# Patient Record
Sex: Male | Born: 1985 | Race: White | Hispanic: No | Marital: Single | Smoking: Current every day smoker
Health system: Southern US, Community
[De-identification: ages and names within clinical notes are randomized; demographics above are authoritative.]

## PROBLEM LIST (undated history)

## (undated) DIAGNOSIS — I10 Essential (primary) hypertension: Secondary | ICD-10-CM

## (undated) DIAGNOSIS — E785 Hyperlipidemia, unspecified: Secondary | ICD-10-CM

## (undated) HISTORY — PX: APPENDECTOMY: SHX54

## (undated) HISTORY — DX: Hemochromatosis, unspecified: E83.119

## (undated) HISTORY — DX: Hyperlipidemia, unspecified: E78.5

## (undated) HISTORY — DX: Essential (primary) hypertension: I10

---
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
## 2018-07-03 ENCOUNTER — Emergency Department
Admission: EM | Admit: 2018-07-03 | Discharge: 2018-07-03 | Disposition: A | Discharge status: Home / Self Care | Payer: 59 | Attending: Emergency Medicine | Admitting: Emergency Medicine

## 2018-07-03 ENCOUNTER — Other Ambulatory Visit: Payer: Self-pay

## 2018-07-03 ENCOUNTER — Emergency Department: Payer: 59

## 2018-07-03 DIAGNOSIS — F17210 Nicotine dependence, cigarettes, uncomplicated: Secondary | ICD-10-CM | POA: Insufficient documentation

## 2018-07-03 DIAGNOSIS — R079 Chest pain, unspecified: Secondary | ICD-10-CM

## 2018-07-03 LAB — CBC
HCT: 46.7 % (ref 39.0–52.0)
Hemoglobin: 16.5 g/dL (ref 13.0–17.0)
MCH: 33.6 pg (ref 26.0–34.0)
MCHC: 35.3 g/dL (ref 30.0–36.0)
MCV: 95.1 fL (ref 80.0–100.0)
Platelets: 240 10*3/uL (ref 150–400)
RBC: 4.91 MIL/uL (ref 4.22–5.81)
RDW: 11.5 % (ref 11.5–15.5)
WBC: 8.3 10*3/uL (ref 4.0–10.5)
nRBC: 0 % (ref 0.0–0.2)

## 2018-07-03 LAB — BASIC METABOLIC PANEL
Anion gap: 9 (ref 5–15)
BUN: 12 mg/dL (ref 6–20)
CO2: 27 mmol/L (ref 22–32)
Calcium: 9.6 mg/dL (ref 8.9–10.3)
Chloride: 102 mmol/L (ref 98–111)
Creatinine, Ser: 0.76 mg/dL (ref 0.61–1.24)
GFR calc Af Amer: >60 mL/min (ref >60)
GFR calc non Af Amer: >60 mL/min (ref >60)
Glucose, Bld: 106 mg/dL — ABNORMAL HIGH (ref 70–99)
Potassium: 4.6 mmol/L (ref 3.5–5.1)
Sodium: 138 mmol/L (ref 135–145)

## 2018-07-03 LAB — TROPONIN I
Troponin I: <0.03 ng/mL (ref <0.03)
Troponin I: <0.03 ng/mL (ref <0.03)

## 2018-07-03 MED ORDER — SODIUM CHLORIDE 0.9% FLUSH: 3.0000 mL | Freq: Once | INTRAVENOUS | Status: DC

## 2018-07-03 NOTE — ED Provider Notes (Signed)
[facility] Emergency Department Provider Note  Time seen: 1:43 PM  I have reviewed the triage vital signs and the nursing notes.   HISTORY  Chief Complaint Chest Pain    HPI Jonathan Fritz is a 33 y.o. male with no past medical history presents to the emergency department for chest discomfort.  According to the patient around 10:00 this morning he developed discomfort in the center of his chest described as more of a pressure sensation with some tingling feeling in his left arm.  Patient states the tingling feeling went away but he continues to have mild chest discomfort especially with movement or if he pushes on the chest.  Denies any cough congestion or fever.  Denies any leg pain or swelling.  No shortness of breath nausea or diaphoresis.  No cardiac history.  Overall the patient appears very well, hypertensive upon arrival with otherwise reassuring vitals.   History reviewed. No pertinent past medical history.  There are no active problems to display for this patient.   Past Surgical History:  Procedure Laterality Date  . APPENDECTOMY      Prior to Admission medications   Not on File    No Known Allergies  No family history on file.  Social History Social History   Tobacco Use  . Smoking status: Current Every Day Smoker    Types: Cigarettes  . Smokeless tobacco: Never Used  Substance Use Topics  . Alcohol use: Yes  . Drug use: Yes    Types: Marijuana    Review of Systems Constitutional: Negative for fever. Cardiovascular: Mild chest pain this morning. Respiratory: Negative for shortness of breath. Gastrointestinal: Negative for abdominal pain, vomiting Musculoskeletal: Negative for musculoskeletal complaints Skin: Negative for skin complaints  Neurological: Negative for headache All other ROS negative  ____________________________________________   PHYSICAL EXAM:  VITAL SIGNS: ED Triage Vitals  Enc Vitals Group     BP  07/03/18 1218 (!) 179/91     Pulse Rate 07/03/18 1218 65     Resp 07/03/18 1218 18     Temp 07/03/18 1218 98.9 F (37.2 C)     Temp Source 07/03/18 1218 Oral     SpO2 07/03/18 1218 98 %     Weight 07/03/18 1219 225 lb (102.1 kg)     Height 07/03/18 1219 6' (1.829 m)     Head Circumference --      Peak Flow --      Pain Score 07/03/18 1229 3     Pain Loc --      Pain Edu? --      Excl. in GC? --    Constitutional: Alert and oriented. Well appearing and in no distress. Eyes: Normal exam ENT      Head: Normocephalic and atraumatic.      Mouth/Throat: Mucous membranes are moist. Cardiovascular: Normal rate, regular rhythm.  Respiratory: Normal respiratory effort without tachypnea nor retractions. Breath sounds are clear  Gastrointestinal: Soft and nontender. No distention.  Musculoskeletal: Nontender with normal range of motion in all extremities. No lower extremity tenderness or edema.  Central chest wall tenderness to palpation. Neurologic:  Normal speech and language. No gross focal neurologic deficits  Skin:  Skin is warm, dry and intact.  Psychiatric: Mood and affect are normal.   ____________________________________________    EKG  EKG viewed and interpreted by myself shows a normal sinus rhythm at 73 bpm with a narrow QRS, normal axis, normal intervals, no concerning ST changes.  ____________________________________________  RADIOLOGY  Chest x-ray is negative.  ____________________________________________   INITIAL IMPRESSION / ASSESSMENT AND PLAN / ED COURSE  Pertinent labs & imaging results that were available during my care of the patient were reviewed by me and considered in my medical decision making (see chart for details).   Patient presents to the emergency department for chest discomfort since around 10:00 this morning.  Patient states minimal discomfort currently mostly with movement or if you push on the chest.  Patient is moderately hypertensive  otherwise normal vitals.  Reassuring physical exam.  EKG shows no acute findings.  Chest x-ray is clear.  We will check labs and continue to closely monitor.  Differential would include ACS, chest wall pain.  Reassuringly labs are normal including negative troponin.  We will repeat a troponin.  If the patient's repeat troponin remains negative anticipate likely discharge home.  Patient agreeable to plan of care.  Patient's repeat troponin is negative.  Continues to appear well.  Is moderately hypertensive I recommended the patient follow-up with her primary care doctor regarding this.  Patient agreeable to plan of care.  Provided my normal chest pain return precautions.  Jonathan Fritz was evaluated in Emergency Department on 07/03/2018 for the symptoms described in the history of present illness. He was evaluated in the context of the global COVID-19 pandemic, which necessitated consideration that the patient might be at risk for infection with the SARS-CoV-2 virus that causes COVID-19. Institutional protocols and algorithms that pertain to the evaluation of patients at risk for COVID-19 are in a state of rapid change based on information released by regulatory bodies including the CDC and federal and state organizations. These policies and algorithms were followed during the patient's care in the ED.  ____________________________________________   FINAL CLINICAL IMPRESSION(S) / ED DIAGNOSES  Chest pain    Minna Antis, MD 07/03/18 1452

## 2018-07-03 NOTE — ED Triage Notes (Signed)
Pt c/o left sided chest pain that radiates into the left arm with tingling for the past couple of hours, states he was at work when it started. Pt is in NAD at present. Respirations WNL

## 2019-04-04 ENCOUNTER — Encounter: Payer: Self-pay | Admitting: Oncology

## 2019-04-04 ENCOUNTER — Other Ambulatory Visit: Payer: Self-pay

## 2019-04-04 ENCOUNTER — Inpatient Hospital Stay: Payer: 59 | Attending: Oncology | Admitting: Oncology

## 2019-04-04 ENCOUNTER — Inpatient Hospital Stay: Payer: 59

## 2019-04-04 VITALS — BP 123/88 | HR 77 | Temp 97.3°F | Ht 72.0 in | Wt 207.0 lb

## 2019-04-04 DIAGNOSIS — E785 Hyperlipidemia, unspecified: Secondary | ICD-10-CM

## 2019-04-04 DIAGNOSIS — R7989 Other specified abnormal findings of blood chemistry: Secondary | ICD-10-CM

## 2019-04-04 DIAGNOSIS — K760 Fatty (change of) liver, not elsewhere classified: Secondary | ICD-10-CM | POA: Diagnosis present

## 2019-04-04 DIAGNOSIS — I10 Essential (primary) hypertension: Secondary | ICD-10-CM | POA: Diagnosis not present

## 2019-04-04 LAB — CBC WITH DIFFERENTIAL/PLATELET
Abs Immature Granulocytes: 0.04 10*3/uL (ref 0.00–0.07)
Basophils Absolute: 0.1 10*3/uL (ref 0.0–0.1)
Basophils Relative: 1 %
Eosinophils Absolute: 0.2 10*3/uL (ref 0.0–0.5)
Eosinophils Relative: 2 %
HCT: 44 % (ref 39.0–52.0)
Hemoglobin: 14.7 g/dL (ref 13.0–17.0)
Immature Granulocytes: 0 %
Lymphocytes Relative: 14 %
Lymphs Abs: 1.5 10*3/uL (ref 0.7–4.0)
MCH: 32 pg (ref 26.0–34.0)
MCHC: 33.4 g/dL (ref 30.0–36.0)
MCV: 95.9 fL (ref 80.0–100.0)
Monocytes Absolute: 0.8 10*3/uL (ref 0.1–1.0)
Monocytes Relative: 7 %
Neutro Abs: 8.1 10*3/uL — ABNORMAL HIGH (ref 1.7–7.7)
Neutrophils Relative %: 76 %
Platelets: 226 10*3/uL (ref 150–400)
RBC: 4.59 MIL/uL (ref 4.22–5.81)
RDW: 11.8 % (ref 11.5–15.5)
WBC: 10.7 10*3/uL — ABNORMAL HIGH (ref 4.0–10.5)
nRBC: 0 % (ref 0.0–0.2)

## 2019-04-04 LAB — COMPREHENSIVE METABOLIC PANEL
ALT: 53 U/L — ABNORMAL HIGH (ref 0–44)
AST: 36 U/L (ref 15–41)
Albumin: 4.8 g/dL (ref 3.5–5.0)
Alkaline Phosphatase: 49 U/L (ref 38–126)
Anion gap: 11 (ref 5–15)
BUN: 16 mg/dL (ref 6–20)
CO2: 24 mmol/L (ref 22–32)
Calcium: 9.6 mg/dL (ref 8.9–10.3)
Chloride: 101 mmol/L (ref 98–111)
Creatinine, Ser: 0.79 mg/dL (ref 0.61–1.24)
GFR calc Af Amer: >60 mL/min (ref >60)
GFR calc non Af Amer: >60 mL/min (ref >60)
Glucose, Bld: 99 mg/dL (ref 70–99)
Potassium: 4.2 mmol/L (ref 3.5–5.1)
Sodium: 136 mmol/L (ref 135–145)
Total Bilirubin: 0.8 mg/dL (ref 0.3–1.2)
Total Protein: 7.8 g/dL (ref 6.5–8.1)

## 2019-04-04 LAB — SEDIMENTATION RATE: Sed Rate: 8 mm/hr (ref 0–15)

## 2019-04-04 LAB — IRON AND TIBC
Iron: 100 ug/dL (ref 45–182)
Saturation Ratios: 28 % (ref 17.9–39.5)
TIBC: 363 ug/dL (ref 250–450)
UIBC: 263 ug/dL

## 2019-04-04 LAB — FERRITIN: Ferritin: 248 ng/mL (ref 24–336)

## 2019-04-04 NOTE — Progress Notes (Signed)
Patient is here today to establish care for hemochromatosis.

## 2019-04-07 ENCOUNTER — Encounter: Payer: Self-pay | Admitting: Oncology

## 2019-04-07 NOTE — Progress Notes (Signed)
Hematology/Oncology Consult note [facility] [phone] [phone]  Patient Care Team: Patient, No Pcp Per as PCP - General (General Practice)   Name of the patient: Jonathan Fritz  [id number]  05-15-1985    Reason for referral- elevated ferritin   Referring physician- Dr. Elijio Miles  Date of visit: 04/07/19   History of presenting illness- Patient is a 34 year old male who has been referred to [place] for evaded ferritin.  His most recent labs are as follows.  In January 2021 he was noted to have an elevated AST of 41 and elevated ALT of 70.  Total bilirubin was normal at 0.5.  Albumin normal at 5.1.  Ferritin levels were elevated at 541.  He also had hepatitis B and Hepatitis a testing which was negative. ceruloplasmin was normal. USG showed fatty liver.   Patient reports drinking couple of beers daily and more during the weekends. hehas been drinking alcohol over 10 years. Denies any complaints presently. No personal and family history of liver disease.   ECOG PS- 0  Pain scale- 0   Review of systems- Review of Systems  Constitutional: Negative for chills, fever, malaise/fatigue and weight loss.  HENT: Negative for congestion, ear discharge and nosebleeds.   Eyes: Negative for blurred vision.  Respiratory: Negative for cough, hemoptysis, sputum production, shortness of breath and wheezing.   Cardiovascular: Negative for chest pain, palpitations, orthopnea and claudication.  Gastrointestinal: Negative for abdominal pain, blood in stool, constipation, diarrhea, heartburn, melena, nausea and vomiting.  Genitourinary: Negative for dysuria, flank pain, frequency, hematuria and urgency.  Musculoskeletal: Negative for back pain, joint pain and myalgias.  Skin: Negative for rash.  Neurological: Negative for dizziness, tingling, focal weakness, seizures, weakness and headaches.  Endo/Heme/Allergies: Does not bruise/bleed easily.    Psychiatric/Behavioral: Negative for depression and suicidal ideas. The patient does not have insomnia.     No Known Allergies  There are no problems to display for this patient.    Past Medical History:  Diagnosis Date  . Hemochromatosis   . Hyperlipidemia   . Hypertension      Past Surgical History:  Procedure Laterality Date  . APPENDECTOMY      Social History   Socioeconomic History  . Marital status: Single    Spouse name: Not on file  . Number of children: Not on file  . Years of education: Not on file  . Highest education level: Not on file  Occupational History  . Not on file  Tobacco Use  . Smoking status: Current Every Day Smoker    Packs/day: 1.00    Years: 15.00    Pack years: 15.00    Types: Cigarettes  . Smokeless tobacco: Never Used  Substance and Sexual Activity  . Alcohol use: Yes    Alcohol/week: 4.0 standard drinks    Types: 4 Cans of beer per week    Comment: 4 beers a day  . Drug use: Yes    Types: Marijuana  . Sexual activity: Not on file  Other Topics Concern  . Not on file  Social History Narrative  . Not on file   Social Determinants of Health   Financial Resource Strain:   . Difficulty of Paying Living Expenses: Not on file  Food Insecurity:   . Worried About Charity fundraiser in the Last Year: Not on file  . Ran Out of Food in the Last Year: Not on file  Transportation Needs:   . Lack of Transportation (Medical):  Not on file  . Lack of Transportation (Non-Medical): Not on file  Physical Activity:   . Days of Exercise per Week: Not on file  . Minutes of Exercise per Session: Not on file  Stress:   . Feeling of Stress : Not on file  Social Connections:   . Frequency of Communication with Friends and Family: Not on file  . Frequency of Social Gatherings with Friends and Family: Not on file  . Attends Religious Services: Not on file  . Active Member of Clubs or Organizations: Not on file  . Attends Banker  Meetings: Not on file  . Marital Status: Not on file  Intimate Partner Violence:   . Fear of Current or Ex-Partner: Not on file  . Emotionally Abused: Not on file  . Physically Abused: Not on file  . Sexually Abused: Not on file     Family History  Problem Relation Age of Onset  . Hypertension Mother   . Diabetes Mellitus II Mother   . Healthy Father   . Healthy Brother   . Pancreatic cancer Maternal Grandfather   . Liver cancer Paternal Grandfather      Current Outpatient Medications:  .  olmesartan-hydrochlorothiazide (BENICAR HCT) 20-12.5 MG tablet, Take 1 tablet by mouth daily., Disp: , Rfl:  .  rosuvastatin (CRESTOR) 20 MG tablet, Take 20 mg by mouth at bedtime., Disp: , Rfl:    Physical exam:  Vitals:   04/04/19 1014  BP: 123/88  Pulse: 77  Temp: (!) 97.3 F (36.3 C)  TempSrc: Tympanic  Weight: 207 lb (93.9 kg)  Height: 6' (1.829 m)   Physical Exam Constitutional:      General: He is not in acute distress. HENT:     Head: Normocephalic and atraumatic.  Eyes:     Pupils: Pupils are equal, round, and reactive to light.  Cardiovascular:     Rate and Rhythm: Normal rate and regular rhythm.     Heart sounds: Normal heart sounds.  Pulmonary:     Effort: Pulmonary effort is normal.     Breath sounds: Normal breath sounds.  Abdominal:     General: Bowel sounds are normal. There is no distension.     Palpations: Abdomen is soft.     Tenderness: There is no abdominal tenderness.     Comments: No hepatosplenomegaly  Musculoskeletal:     Cervical back: Normal range of motion.  Skin:    General: Skin is warm and dry.  Neurological:     Mental Status: He is alert and oriented to person, place, and time.        CMP Latest Ref Rng & Units 04/04/2019  Glucose 70 - 99 mg/dL 99  BUN 6 - 20 mg/dL 16  Creatinine 6.22 - 6.33 mg/dL 3.54  Sodium 562 - 563 mmol/L 136  Potassium 3.5 - 5.1 mmol/L 4.2  Chloride 98 - 111 mmol/L 101  CO2 22 - 32 mmol/L 24  Calcium 8.9  - 10.3 mg/dL 9.6  Total Protein 6.5 - 8.1 g/dL 7.8  Total Bilirubin 0.3 - 1.2 mg/dL 0.8  Alkaline Phos 38 - 126 U/L 49  AST 15 - 41 U/L 36  ALT 0 - 44 U/L 53(H)   CBC Latest Ref Rng & Units 04/04/2019  WBC 4.0 - 10.5 K/uL 10.7(H)  Hemoglobin 13.0 - 17.0 g/dL 89.3  Hematocrit 73.4 - 52.0 % 44.0  Platelets 150 - 400 K/uL 226     Assessment and plan- Patient is a  34 y.o. male referred for elevated ferritin likely due to alcohol intake  Suspect elevated ferritin, mildly abnormal LFTs and fatty liver all secondary to alcohol intake. I have advised him strongly to abstain from alcohol or atleast cut down drinking alcohol to fewer than 1 drink per day. I doubt patient has hemochromatosis but I will send out HFE gene testing and repeat ferritin an diron studies. Ferritin levels likely to normalize when alcohol intake comes down   Thank you for this kind referral and the opportunity to participate in the care of this patient   Visit Diagnosis 1. Fatty liver   2. Elevated ferritin     Dr. Owens Shark, MD, MPH [facility] at [facility] [phone] 04/07/2019  5:13 PM

## 2019-04-11 LAB — HEMOCHROMATOSIS DNA-PCR(C282Y,H63D)

## 2019-04-16 ENCOUNTER — Inpatient Hospital Stay (HOSPITAL_BASED_OUTPATIENT_CLINIC_OR_DEPARTMENT_OTHER): Payer: 59 | Admitting: Oncology

## 2019-04-16 ENCOUNTER — Encounter: Payer: Self-pay | Admitting: Oncology

## 2019-04-16 ENCOUNTER — Other Ambulatory Visit: Payer: Self-pay

## 2019-04-16 DIAGNOSIS — Z148 Genetic carrier of other disease: Secondary | ICD-10-CM | POA: Diagnosis not present

## 2019-04-16 DIAGNOSIS — R7989 Other specified abnormal findings of blood chemistry: Secondary | ICD-10-CM | POA: Diagnosis not present

## 2019-04-16 DIAGNOSIS — F1010 Alcohol abuse, uncomplicated: Secondary | ICD-10-CM

## 2019-04-16 NOTE — Progress Notes (Signed)
Patient stated that he had been having some difficulty sleeping but not taking anything to help him. Patient also stated that his appetite is not as good as before and when he doesn't eat, he gets nauseated but no vomiting.

## 2019-04-19 NOTE — Progress Notes (Signed)
I connected with Jonathan Fritz on 04/19/19 at  1:00 PM EST by video enabled telemedicine visit and verified that I am speaking with the correct person using two identifiers.   I discussed the limitations, risks, security and privacy concerns of performing an evaluation and management service by telemedicine and the availability of in-person appointments. I also discussed with the patient that there may be a patient responsible charge related to this service. The patient expressed understanding and agreed to proceed.  Other persons participating in the visit and their role in the encounter:  none  Patient's location:  home Provider's location:  work  Stage manager Complaint:  Discuss results of blood work  Elevated ferritin and abnormal LFTs likely secondary to alcohol abuse  History of present illness: Patient is a 34 year old male who has been referred to [place] for evaded ferritin.  His most recent labs are as follows.  In January 2021 he was noted to have an elevated AST of 41 and elevated ALT of 70.  Total bilirubin was normal at 0.5.  Albumin normal at 5.1.  Ferritin levels were elevated at 541.  He also had hepatitis B and Hepatitis a testing which was negative. ceruloplasmin was normal. USG showed fatty liver.   Results of blood work from 06/02/2019 were as follows: CBC with differential showed white cell count of 10.7, H&H of 14.7/44 and a platelet count of 326.  CMP showed elevated ALT of 53.  AST bilirubin and alkaline phosphatase were normal.  Ferritin levels were normal at 248.  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  Iron study showed a normal iron saturation of 28%  Interval history : Patient reports that he has cut back on his alcohol intake significantly but has not completely abstain from alcohol.  Otherwise reports feeling well and denies any complaints   Review of Systems  Constitutional: Negative for chills, fever, malaise/fatigue and weight loss.  HENT: Negative for  congestion, ear discharge and nosebleeds.   Eyes: Negative for blurred vision.  Respiratory: Negative for cough, hemoptysis, sputum production, shortness of breath and wheezing.   Cardiovascular: Negative for chest pain, palpitations, orthopnea and claudication.  Gastrointestinal: Negative for abdominal pain, blood in stool, constipation, diarrhea, heartburn, melena, nausea and vomiting.  Genitourinary: Negative for dysuria, flank pain, frequency, hematuria and urgency.  Musculoskeletal: Negative for back pain, joint pain and myalgias.  Skin: Negative for rash.  Neurological: Negative for dizziness, tingling, focal weakness, seizures, weakness and headaches.  Endo/Heme/Allergies: Does not bruise/bleed easily.  Psychiatric/Behavioral: Negative for depression and suicidal ideas. The patient does not have insomnia.     No Known Allergies  Past Medical History:  Diagnosis Date  . Hemochromatosis   . Hyperlipidemia   . Hypertension     Past Surgical History:  Procedure Laterality Date  . APPENDECTOMY      Social History   Socioeconomic History  . Marital status: Single    Spouse name: Not on file  . Number of children: Not on file  . Years of education: Not on file  . Highest education level: Not on file  Occupational History  . Not on file  Tobacco Use  . Smoking status: Current Every Day Smoker    Packs/day: 1.00    Years: 15.00    Pack years: 15.00    Types: Cigarettes  . Smokeless tobacco: Never Used  Substance and Sexual Activity  . Alcohol use: Yes    Alcohol/week: 4.0 standard drinks    Types: 4 Cans of beer per  week    Comment: 4 beers a day  . Drug use: Yes    Types: Marijuana  . Sexual activity: Not on file  Other Topics Concern  . Not on file  Social History Narrative  . Not on file   Social Determinants of Health   Financial Resource Strain:   . Difficulty of Paying Living Expenses: Not on file  Food Insecurity:   . Worried About Sales executive in the Last Year: Not on file  . Ran Out of Food in the Last Year: Not on file  Transportation Needs:   . Lack of Transportation (Medical): Not on file  . Lack of Transportation (Non-Medical): Not on file  Physical Activity:   . Days of Exercise per Week: Not on file  . Minutes of Exercise per Session: Not on file  Stress:   . Feeling of Stress : Not on file  Social Connections:   . Frequency of Communication with Friends and Family: Not on file  . Frequency of Social Gatherings with Friends and Family: Not on file  . Attends Religious Services: Not on file  . Active Member of Clubs or Organizations: Not on file  . Attends Archivist Meetings: Not on file  . Marital Status: Not on file  Intimate Partner Violence:   . Fear of Current or Ex-Partner: Not on file  . Emotionally Abused: Not on file  . Physically Abused: Not on file  . Sexually Abused: Not on file    Family History  Problem Relation Age of Onset  . Hypertension Mother   . Diabetes Mellitus II Mother   . Healthy Father   . Healthy Brother   . Pancreatic cancer Maternal Grandfather   . Liver cancer Paternal Grandfather      Current Outpatient Medications:  .  olmesartan-hydrochlorothiazide (BENICAR HCT) 20-12.5 MG tablet, Take 1 tablet by mouth daily., Disp: , Rfl:  .  rosuvastatin (CRESTOR) 20 MG tablet, Take 20 mg by mouth at bedtime., Disp: , Rfl:   No results found.  No images are attached to the encounter.   CMP Latest Ref Rng & Units 04/04/2019  Glucose 70 - 99 mg/dL 99  BUN 6 - 20 mg/dL 16  Creatinine 0.61 - 1.24 mg/dL 0.79  Sodium 135 - 145 mmol/L 136  Potassium 3.5 - 5.1 mmol/L 4.2  Chloride 98 - 111 mmol/L 101  CO2 22 - 32 mmol/L 24  Calcium 8.9 - 10.3 mg/dL 9.6  Total Protein 6.5 - 8.1 g/dL 7.8  Total Bilirubin 0.3 - 1.2 mg/dL 0.8  Alkaline Phos 38 - 126 U/L 49  AST 15 - 41 U/L 36  ALT 0 - 44 U/L 53(H)   CBC Latest Ref Rng & Units 04/04/2019  WBC 4.0 - 10.5 K/uL 10.7(H)   Hemoglobin 13.0 - 17.0 g/dL 14.7  Hematocrit 39.0 - 52.0 % 44.0  Platelets 150 - 400 K/uL 226     Observation/objective: Appears in no acute distress over video visit today.  Breathing is nonlabored  Assessment and plan: Patient is a 34 year old male referred for elevated ferritin likely due to alcohol intake  Patient's ferritin level has come down to 248 from 541.  Also his AST and ALT have trended down.  I suspect his elevated ferritin and abnormal LFTs are secondary to his alcohol intake.  Patient was also noted to have a single gene mutation of H63D.  This did not result in clinical iron overload or hemochromatosis.  However the  fact that he has 1 abnormal gene predispose him to iron overload if he has continued increased alcohol intake.  I therefore reemphasized that he should try to completely quit alcohol if possible or certainly cut down his drinking to fewer than 1-2 drinks per week.  Patient verbalized understanding.  Follow-up instructions: CBC, CMP and ferritin in 6 months followed by video visit  I discussed the assessment and treatment plan with the patient. The patient was provided an opportunity to ask questions and all were answered. The patient agreed with the plan and demonstrated an understanding of the instructions.   The patient was advised to call back or seek an in-person evaluation if the symptoms worsen or if the condition fails to improve as anticipated.   Visit Diagnosis: 1. Elevated ferritin   2. 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 Owens Shark, MD, MPH [facility] at [facility] Tel- [phone] 04/19/2019 10:02 AM

## 2019-10-14 ENCOUNTER — Other Ambulatory Visit: Payer: Self-pay

## 2019-10-14 ENCOUNTER — Inpatient Hospital Stay: Payer: 59 | Attending: Oncology

## 2019-10-14 DIAGNOSIS — Z148 Genetic carrier of other disease: Secondary | ICD-10-CM

## 2019-10-14 DIAGNOSIS — R7989 Other specified abnormal findings of blood chemistry: Secondary | ICD-10-CM

## 2019-10-14 LAB — COMPREHENSIVE METABOLIC PANEL
ALT: 31 U/L (ref 0–44)
AST: 27 U/L (ref 15–41)
Albumin: 4.6 g/dL (ref 3.5–5.0)
Alkaline Phosphatase: 44 U/L (ref 38–126)
Anion gap: 9 (ref 5–15)
BUN: 16 mg/dL (ref 6–20)
CO2: 26 mmol/L (ref 22–32)
Calcium: 9.1 mg/dL (ref 8.9–10.3)
Chloride: 102 mmol/L (ref 98–111)
Creatinine, Ser: 1.08 mg/dL (ref 0.61–1.24)
GFR calc Af Amer: >60 mL/min (ref >60)
GFR calc non Af Amer: >60 mL/min (ref >60)
Glucose, Bld: 133 mg/dL — ABNORMAL HIGH (ref 70–99)
Potassium: 3.4 mmol/L — ABNORMAL LOW (ref 3.5–5.1)
Sodium: 137 mmol/L (ref 135–145)
Total Bilirubin: 0.6 mg/dL (ref 0.3–1.2)
Total Protein: 7.6 g/dL (ref 6.5–8.1)

## 2019-10-14 LAB — CBC WITH DIFFERENTIAL/PLATELET
Abs Immature Granulocytes: 0.01 10*3/uL (ref 0.00–0.07)
Basophils Absolute: 0 10*3/uL (ref 0.0–0.1)
Basophils Relative: 1 %
Eosinophils Absolute: 0.1 10*3/uL (ref 0.0–0.5)
Eosinophils Relative: 2 %
HCT: 36.7 % — ABNORMAL LOW (ref 39.0–52.0)
Hemoglobin: 13.2 g/dL (ref 13.0–17.0)
Immature Granulocytes: 0 %
Lymphocytes Relative: 24 %
Lymphs Abs: 1.6 10*3/uL (ref 0.7–4.0)
MCH: 33.9 pg (ref 26.0–34.0)
MCHC: 36 g/dL (ref 30.0–36.0)
MCV: 94.3 fL (ref 80.0–100.0)
Monocytes Absolute: 0.5 10*3/uL (ref 0.1–1.0)
Monocytes Relative: 7 %
Neutro Abs: 4.3 10*3/uL (ref 1.7–7.7)
Neutrophils Relative %: 66 %
Platelets: 208 10*3/uL (ref 150–400)
RBC: 3.89 MIL/uL — ABNORMAL LOW (ref 4.22–5.81)
RDW: 12.3 % (ref 11.5–15.5)
WBC: 6.4 10*3/uL (ref 4.0–10.5)
nRBC: 0 % (ref 0.0–0.2)

## 2019-10-14 LAB — FERRITIN: Ferritin: 286 ng/mL (ref 24–336)

## 2019-10-15 ENCOUNTER — Inpatient Hospital Stay (HOSPITAL_BASED_OUTPATIENT_CLINIC_OR_DEPARTMENT_OTHER): Payer: 59 | Admitting: Oncology

## 2019-10-15 DIAGNOSIS — R7989 Other specified abnormal findings of blood chemistry: Secondary | ICD-10-CM

## 2019-10-15 NOTE — Progress Notes (Signed)
I connected with Jonathan Fritz on 10/15/19 at  1:00 PM EDT by video enabled telemedicine visit and verified that I am speaking with the correct person using two identifiers.   I discussed the limitations, risks, security and privacy concerns of performing an evaluation and management service by telemedicine and the availability of in-person appointments. I also discussed with the patient that there may be a patient responsible charge related to this service. The patient expressed understanding and agreed to proceed.  Other persons participating in the visit and their role in the encounter:  none  Patient's location:  car Provider's location:  work  Stage manager Complaint: Elevated ferritin likely secondary to alcohol intake.  This is a routine follow-up visit  History of present illness: Patient is a 34 year old male who has been referred to [place] for evaded ferritin. His most recent labs are as follows. In January 2021 he was noted to have an elevated AST of 41 and elevated ALT of 70. Total bilirubin was normal at 0.5. Albumin normal at 5.1. Ferritin levels were elevated at 541. He also had hepatitis B and Hepatitis a testing which was negative. ceruloplasmin was normal.USG showed fatty liver.   Results of blood work from 06/02/2019 were as follows: CBC with differential showed white cell count of 10.7, H&H of 14.7/44 and a platelet count of 326.  CMP showed elevated ALT of 53.  AST bilirubin and alkaline phosphatase were normal.  Ferritin levels were normal at 248.  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  Iron study showed a normal iron saturation of 28%   Interval history: Patient reports doing well and denies any health complaints at this time. He still drinks alcohol but only occasionally.   Review of Systems  Constitutional: Negative for chills, fever, malaise/fatigue and weight loss.  HENT: Negative for congestion, ear discharge and nosebleeds.   Eyes: Negative for blurred  vision.  Respiratory: Negative for cough, hemoptysis, sputum production, shortness of breath and wheezing.   Cardiovascular: Negative for chest pain, palpitations, orthopnea and claudication.  Gastrointestinal: Negative for abdominal pain, blood in stool, constipation, diarrhea, heartburn, melena, nausea and vomiting.  Genitourinary: Negative for dysuria, flank pain, frequency, hematuria and urgency.  Musculoskeletal: Negative for back pain, joint pain and myalgias.  Skin: Negative for rash.  Neurological: Negative for dizziness, tingling, focal weakness, seizures, weakness and headaches.  Endo/Heme/Allergies: Does not bruise/bleed easily.  Psychiatric/Behavioral: Negative for depression and suicidal ideas. The patient does not have insomnia.     No Known Allergies  Past Medical History:  Diagnosis Date  . Hemochromatosis   . Hyperlipidemia   . Hypertension     Past Surgical History:  Procedure Laterality Date  . APPENDECTOMY      Social History   Socioeconomic History  . Marital status: Single    Spouse name: Not on file  . Number of children: Not on file  . Years of education: Not on file  . Highest education level: Not on file  Occupational History  . Not on file  Tobacco Use  . Smoking status: Current Every Day Smoker    Packs/day: 1.00    Years: 15.00    Pack years: 15.00    Types: Cigarettes  . Smokeless tobacco: Never Used  Substance and Sexual Activity  . Alcohol use: Yes    Alcohol/week: 4.0 standard drinks    Types: 4 Cans of beer per week    Comment: 4 beers a day  . Drug use: Yes    Types: Marijuana  .  Sexual activity: Not on file  Other Topics Concern  . Not on file  Social History Narrative  . Not on file   Social Determinants of Health   Financial Resource Strain:   . Difficulty of Paying Living Expenses: Not on file  Food Insecurity:   . Worried About Programme researcher, broadcasting/film/video in the Last Year: Not on file  . Ran Out of Food in the Last Year:  Not on file  Transportation Needs:   . Lack of Transportation (Medical): Not on file  . Lack of Transportation (Non-Medical): Not on file  Physical Activity:   . Days of Exercise per Week: Not on file  . Minutes of Exercise per Session: Not on file  Stress:   . Feeling of Stress : Not on file  Social Connections:   . Frequency of Communication with Friends and Family: Not on file  . Frequency of Social Gatherings with Friends and Family: Not on file  . Attends Religious Services: Not on file  . Active Member of Clubs or Organizations: Not on file  . Attends Banker Meetings: Not on file  . Marital Status: Not on file  Intimate Partner Violence:   . Fear of Current or Ex-Partner: Not on file  . Emotionally Abused: Not on file  . Physically Abused: Not on file  . Sexually Abused: Not on file    Family History  Problem Relation Age of Onset  . Hypertension Mother   . Diabetes Mellitus II Mother   . Healthy Father   . Healthy Brother   . Pancreatic cancer Maternal Grandfather   . Liver cancer Paternal Grandfather      Current Outpatient Medications:  .  olmesartan-hydrochlorothiazide (BENICAR HCT) 20-12.5 MG tablet, Take 1 tablet by mouth daily., Disp: , Rfl:  .  rosuvastatin (CRESTOR) 20 MG tablet, Take 20 mg by mouth at bedtime., Disp: , Rfl:   No results found.  No images are attached to the encounter.   CMP Latest Ref Rng & Units 10/14/2019  Glucose 70 - 99 mg/dL 841(L)  BUN 6 - 20 mg/dL 16  Creatinine 2.44 - 0.10 mg/dL 2.72  Sodium 536 - 644 mmol/L 137  Potassium 3.5 - 5.1 mmol/L 3.4(L)  Chloride 98 - 111 mmol/L 102  CO2 22 - 32 mmol/L 26  Calcium 8.9 - 10.3 mg/dL 9.1  Total Protein 6.5 - 8.1 g/dL 7.6  Total Bilirubin 0.3 - 1.2 mg/dL 0.6  Alkaline Phos 38 - 126 U/L 44  AST 15 - 41 U/L 27  ALT 0 - 44 U/L 31   CBC Latest Ref Rng & Units 10/14/2019  WBC 4.0 - 10.5 K/uL 6.4  Hemoglobin 13.0 - 17.0 g/dL 03.4  Hematocrit 39 - 52 % 36.7(L)  Platelets  150 - 400 K/uL 208     Observation/objective: Appears in no acute distress over video visit today.  Breathing is nonlabored  Assessment and plan: Patient is a 34 year old male referred for elevated ferritin likely secondary to alcohol intake  Patient reports that he has cut back on his alcohol intake.  His LFTs are presently normal And his ferritin is normal at 286.  He does not require phlebotomy at this time.  Single mutation for H63D does not need to symptomatic iron overload but it would be important for patient to be screened for the use of alcohol.   Patient is also not vaccinated against Covid and I have encouraged him to do so  Follow-up instructions: CBC CMP  and ferritin in 6 months in 1 year and I will see him back in 1 year  I discussed the assessment and treatment plan with the patient. The patient was provided an opportunity to ask questions and all were answered. The patient agreed with the plan and demonstrated an understanding of the instructions.   The patient was advised to call back or seek an in-person evaluation if the symptoms worsen or if the condition fails to improve as anticipated.   Visit Diagnosis: 1. Elevated ferritin     Dr. Owens Shark, MD, MPH [facility] Tel- [phone] 10/15/2019 2:09 PM

## 2019-12-15 IMAGING — CR CHEST - 2 VIEW
2 series · 2 of 2 positions shown · non-contrast
Comparison: None.

CLINICAL DATA: Chest pain radiating to left arm

EXAM:
CHEST - 2 VIEW

[chest pa]
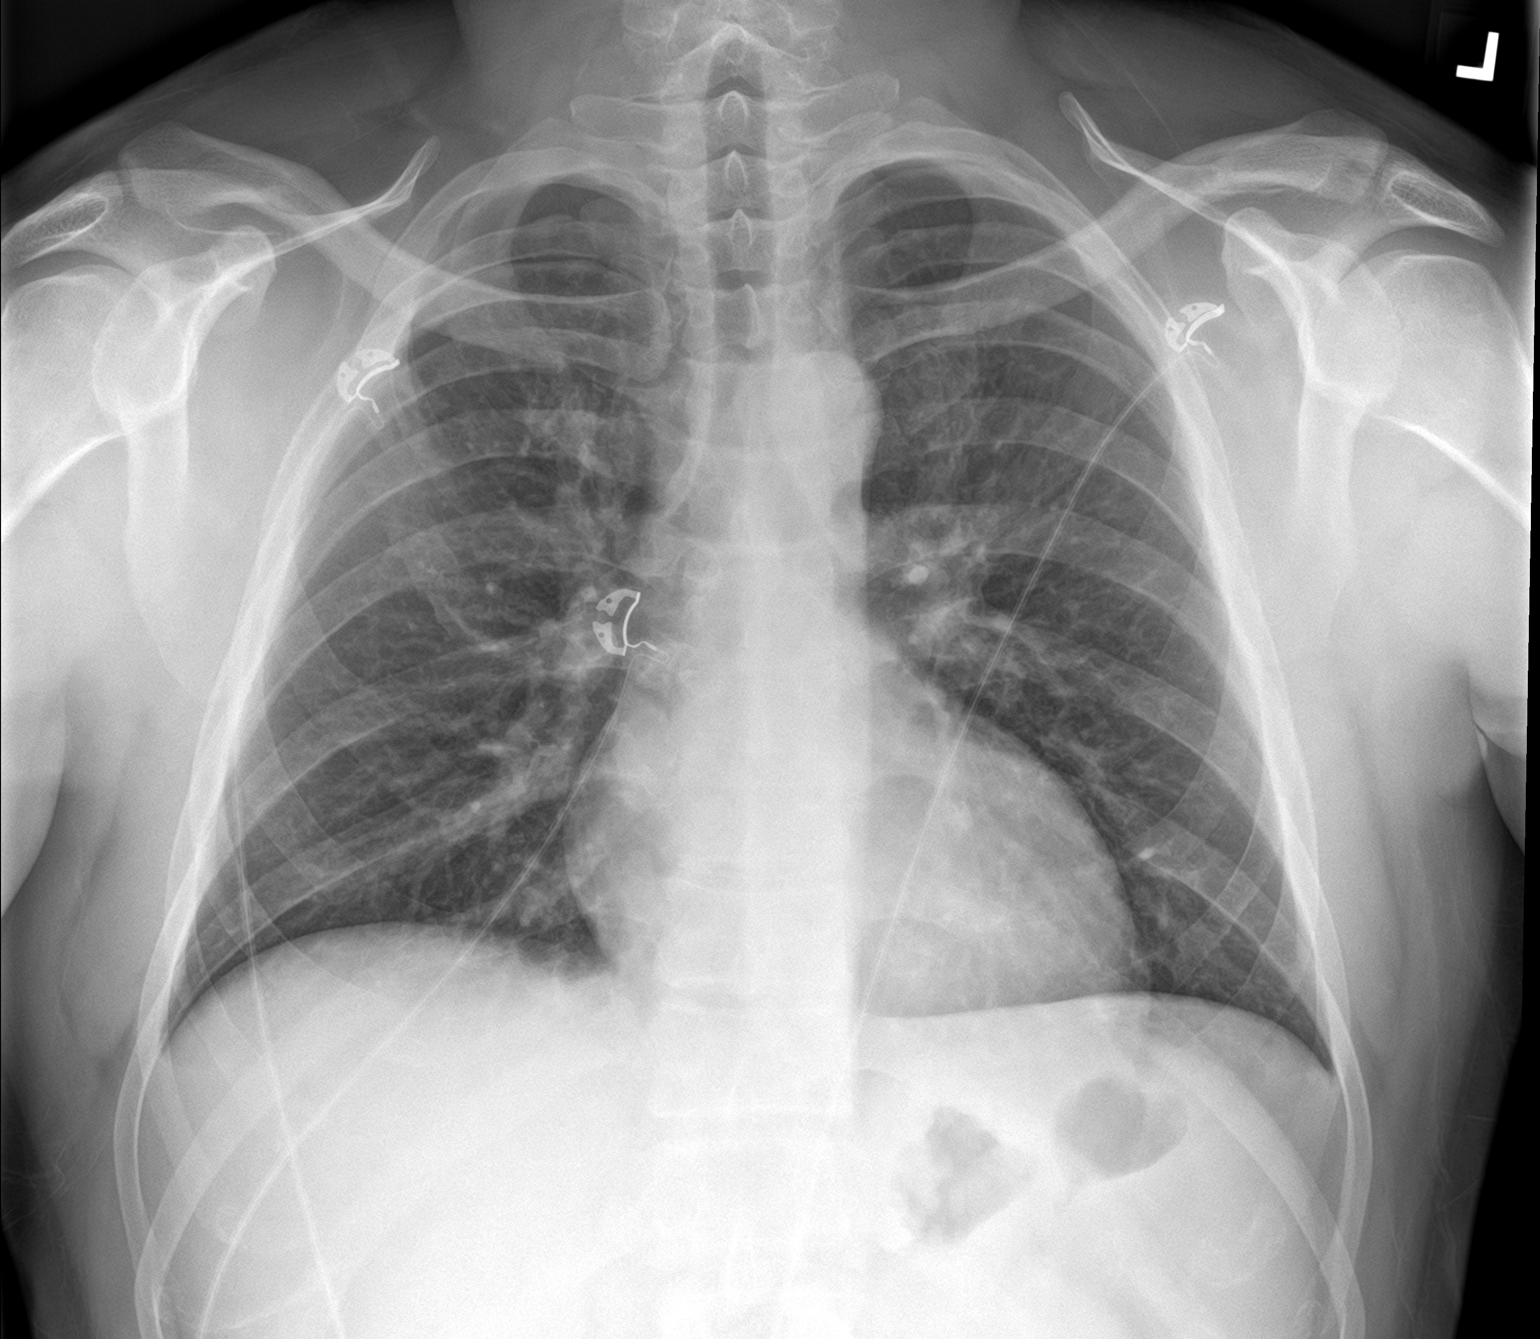

[chest lat]
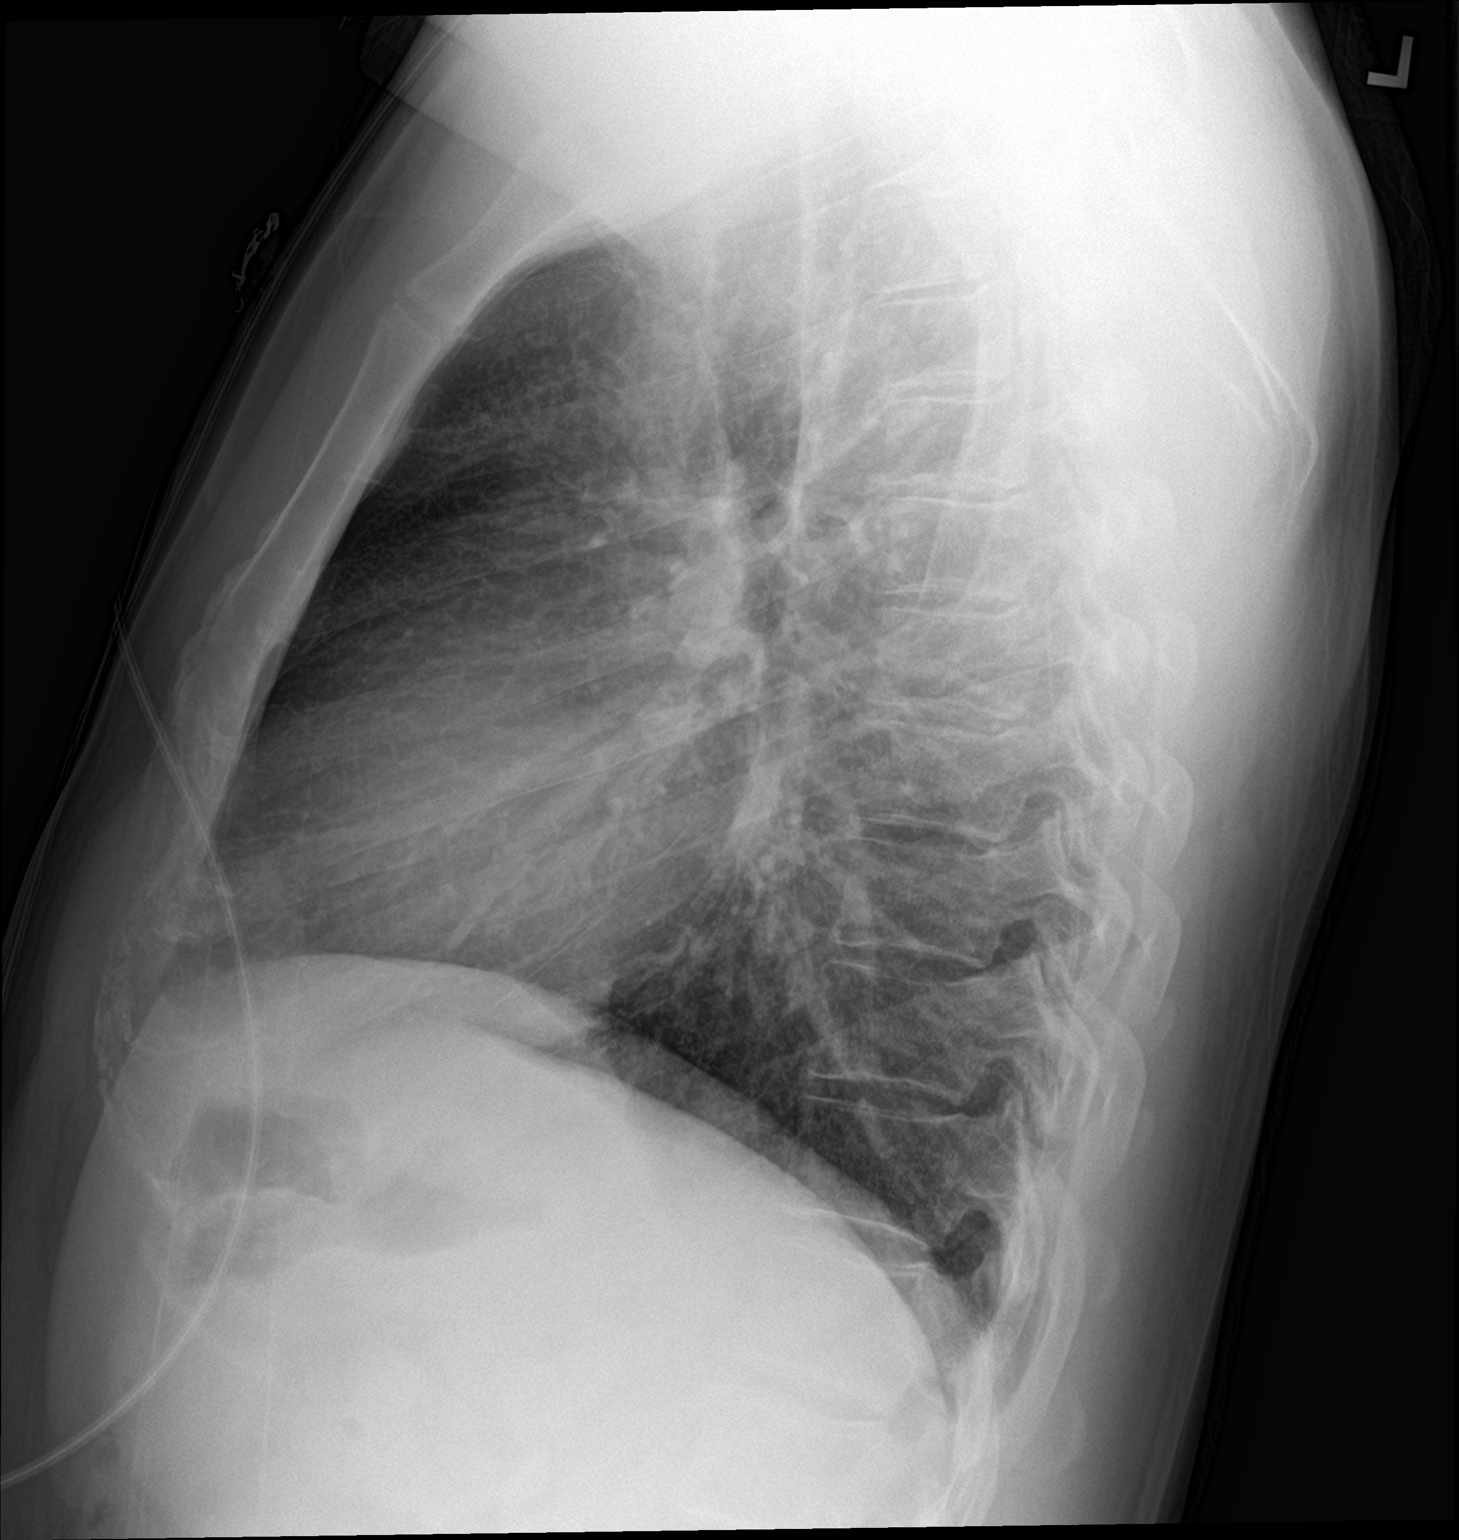

[2 of 2 positions shown; findings below may reference images not displayed]

FINDINGS: Normal heart size and vascularity. Lungs remain clear. No focal
pneumonia, collapse or consolidation. Negative for edema, effusion
or thorax. Trachea midline. Old anterior right rib fractures noted.
No acute osseous finding.
IMPRESSION: No acute chest process.

## 2020-04-16 ENCOUNTER — Other Ambulatory Visit: Payer: 59

## 2020-04-23 ENCOUNTER — Inpatient Hospital Stay: Payer: Self-pay | Attending: Oncology

## 2020-10-13 ENCOUNTER — Other Ambulatory Visit: Payer: Self-pay

## 2020-10-13 DIAGNOSIS — Z148 Genetic carrier of other disease: Secondary | ICD-10-CM

## 2020-10-13 DIAGNOSIS — R7989 Other specified abnormal findings of blood chemistry: Secondary | ICD-10-CM

## 2020-10-14 ENCOUNTER — Inpatient Hospital Stay: Payer: No Typology Code available for payment source | Attending: Oncology

## 2020-10-14 DIAGNOSIS — R7989 Other specified abnormal findings of blood chemistry: Secondary | ICD-10-CM | POA: Insufficient documentation

## 2020-10-14 DIAGNOSIS — Z148 Genetic carrier of other disease: Secondary | ICD-10-CM

## 2020-10-14 LAB — CBC
HCT: 38.6 % — ABNORMAL LOW (ref 39.0–52.0)
Hemoglobin: 13.7 g/dL (ref 13.0–17.0)
MCH: 33.6 pg (ref 26.0–34.0)
MCHC: 35.5 g/dL (ref 30.0–36.0)
MCV: 94.6 fL (ref 80.0–100.0)
Platelets: 198 10*3/uL (ref 150–400)
RBC: 4.08 MIL/uL — ABNORMAL LOW (ref 4.22–5.81)
RDW: 11.8 % (ref 11.5–15.5)
WBC: 8.3 10*3/uL (ref 4.0–10.5)
nRBC: 0 % (ref 0.0–0.2)

## 2020-10-14 LAB — COMPREHENSIVE METABOLIC PANEL
ALT: 40 U/L (ref 0–44)
AST: 34 U/L (ref 15–41)
Albumin: 4.6 g/dL (ref 3.5–5.0)
Alkaline Phosphatase: 41 U/L (ref 38–126)
Anion gap: 6 (ref 5–15)
BUN: 16 mg/dL (ref 6–20)
CO2: 29 mmol/L (ref 22–32)
Calcium: 9.3 mg/dL (ref 8.9–10.3)
Chloride: 100 mmol/L (ref 98–111)
Creatinine, Ser: 1 mg/dL (ref 0.61–1.24)
GFR, Estimated: >60 mL/min (ref >60)
Glucose, Bld: 97 mg/dL (ref 70–99)
Potassium: 3.6 mmol/L (ref 3.5–5.1)
Sodium: 135 mmol/L (ref 135–145)
Total Bilirubin: 0.6 mg/dL (ref 0.3–1.2)
Total Protein: 7.2 g/dL (ref 6.5–8.1)

## 2020-10-14 LAB — FERRITIN: Ferritin: 256 ng/mL (ref 24–336)

## 2020-10-16 ENCOUNTER — Encounter: Payer: Self-pay | Admitting: Oncology

## 2020-10-16 ENCOUNTER — Inpatient Hospital Stay (HOSPITAL_BASED_OUTPATIENT_CLINIC_OR_DEPARTMENT_OTHER): Payer: No Typology Code available for payment source | Admitting: Oncology

## 2020-10-16 DIAGNOSIS — Z148 Genetic carrier of other disease: Secondary | ICD-10-CM

## 2020-10-16 NOTE — Progress Notes (Signed)
I connected with Jonathan Fritz on 10/16/20 at  2:15 PM EDT by telephone visit and verified that I am speaking with the correct person using two identifiers.   I discussed the limitations, risks, security and privacy concerns of performing an evaluation and management service by telemedicine and the availability of in-person appointments. I also discussed with the patient that there may be a patient responsible charge related to this service. The patient expressed understanding and agreed to proceed.  Other persons participating in the visit and their role in the encounter:  none  Patient's location:  Home Provider's location:  Clinic   Chief Complaint: Elevated ferritin likely secondary to alcohol intake.  This is a routine follow-up visit  History of present illness:  Patient is a 35 year old male who has been referred to [place] for evaded ferritin.  His most recent labs are as follows.  In January 2021 he was noted to have an elevated AST of 41 and elevated ALT of 70.  Total bilirubin was normal at 0.5.  Albumin normal at 5.1.  Ferritin levels were elevated at 541.  He also had hepatitis B and Hepatitis a testing which was negative. ceruloplasmin was normal. USG showed fatty liver.    Results of blood work from 06/02/2019 were as follows: CBC with differential showed white cell count of 10.7, H&H of 14.7/44 and a platelet count of 326.  CMP showed elevated ALT of 53.  AST bilirubin and alkaline phosphatase were normal.  Ferritin levels were normal at 248.  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  Iron study showed a normal iron saturation of 28%   Interval history: Patient reports doing well denies any new health concerns at this time.  He continues to drink alcohol but has cut back quite a bit.   Review of Systems  Constitutional: Negative.  Negative for chills, fever, malaise/fatigue and weight loss.  HENT:  Negative for congestion, ear pain and tinnitus.   Eyes: Negative.  Negative  for blurred vision and double vision.  Respiratory: Negative.  Negative for cough, sputum production and shortness of breath.   Cardiovascular: Negative.  Negative for chest pain, palpitations and leg swelling.  Gastrointestinal: Negative.  Negative for abdominal pain, constipation, diarrhea, nausea and vomiting.  Genitourinary:  Negative for dysuria, frequency and urgency.  Musculoskeletal:  Negative for back pain and falls.  Skin: Negative.  Negative for rash.  Neurological: Negative.  Negative for weakness and headaches.  Endo/Heme/Allergies: Negative.  Does not bruise/bleed easily.  Psychiatric/Behavioral: Negative.  Negative for depression. The patient is not nervous/anxious and does not have insomnia.    No Known Allergies  Past Medical History:  Diagnosis Date   Hemochromatosis    Hyperlipidemia    Hypertension     Past Surgical History:  Procedure Laterality Date   APPENDECTOMY      Social History   Socioeconomic History   Marital status: Single    Spouse name: Not on file   Number of children: Not on file   Years of education: Not on file   Highest education level: Not on file  Occupational History   Not on file  Tobacco Use   Smoking status: Every Day    Packs/day: 1.00    Years: 15.00    Pack years: 15.00    Types: Cigarettes   Smokeless tobacco: Never  Substance and Sexual Activity   Alcohol use: Yes    Alcohol/week: 4.0 standard drinks    Types: 4 Cans of beer per week  Comment: 4 beers a day   Drug use: Yes    Types: Marijuana   Sexual activity: Not on file  Other Topics Concern   Not on file  Social History Narrative   Not on file   Social Determinants of Health   Financial Resource Strain: Not on file  Food Insecurity: Not on file  Transportation Needs: Not on file  Physical Activity: Not on file  Stress: Not on file  Social Connections: Not on file  Intimate Partner Violence: Not on file    Family History  Problem Relation Age of  Onset   Hypertension Mother    Diabetes Mellitus II Mother    Healthy Father    Healthy Brother    Pancreatic cancer Maternal Grandfather    Liver cancer Paternal Grandfather      Current Outpatient Medications:    olmesartan-hydrochlorothiazide (BENICAR HCT) 20-12.5 MG tablet, Take 1 tablet by mouth daily., Disp: , Rfl:    rosuvastatin (CRESTOR) 20 MG tablet, Take 20 mg by mouth at bedtime., Disp: , Rfl:   No results found.  No images are attached to the encounter.   CMP Latest Ref Rng & Units 10/14/2020  Glucose 70 - 99 mg/dL 97  BUN 6 - 20 mg/dL 16  Creatinine 6.78 - 9.38 mg/dL 1.01  Sodium 751 - 025 mmol/L 135  Potassium 3.5 - 5.1 mmol/L 3.6  Chloride 98 - 111 mmol/L 100  CO2 22 - 32 mmol/L 29  Calcium 8.9 - 10.3 mg/dL 9.3  Total Protein 6.5 - 8.1 g/dL 7.2  Total Bilirubin 0.3 - 1.2 mg/dL 0.6  Alkaline Phos 38 - 126 U/L 41  AST 15 - 41 U/L 34  ALT 0 - 44 U/L 40   CBC Latest Ref Rng & Units 10/14/2020  WBC 4.0 - 10.5 K/uL 8.3  Hemoglobin 13.0 - 17.0 g/dL 85.2  Hematocrit 77.8 - 52.0 % 38.6(L)  Platelets 150 - 400 K/uL 198     Objective: He is speaking in full complete sentences breathing is nonlabored.  Assessment and plan: Jonathan Fritz is a 35 year old male who presents for follow-up for his elevated ferritin thought to be secondary to alcohol intake.  He reports he continues to cut back on his alcohol intake but continues to drink.  Labs from 10/14/2020 are WNL.  Previous work-up showed a single mutation for H63D mutation which can place him at higher risk for iron overload and liver disease.  He continues to drink alcohol although he was counseled on this.  Iron levels from 10/14/2020 show a ferritin of 256.  LFTs are normal.  He does not require phlebotomy at this time.  We will continue to follow his lab work every 6 months and see him back in 1 year.   I spent 15 minutes dedicated to the care of this patient (face-to-face and non-face-to-face) on the date of the  encounter to include what is described in the assessment and plan.  I discussed the assessment and treatment plan with the patient. The patient was provided an opportunity to ask questions and all were answered. The patient agreed with the plan and demonstrated an understanding of the instructions.   The patient was advised to call back or seek an in-person evaluation if the symptoms worsen or if the condition fails to improve as anticipated.   Visit Diagnosis: 1. Carrier of hemochromatosis HFE gene mutation    Durenda Hurt, NP 10/16/2020 3:15 PM

## 2021-04-13 ENCOUNTER — Other Ambulatory Visit: Payer: Self-pay | Admitting: *Deleted

## 2021-04-13 DIAGNOSIS — R7989 Other specified abnormal findings of blood chemistry: Secondary | ICD-10-CM

## 2021-04-13 DIAGNOSIS — Z148 Genetic carrier of other disease: Secondary | ICD-10-CM

## 2021-04-13 DIAGNOSIS — F1010 Alcohol abuse, uncomplicated: Secondary | ICD-10-CM

## 2021-04-19 ENCOUNTER — Inpatient Hospital Stay: Payer: No Typology Code available for payment source | Attending: Oncology

## 2021-10-15 ENCOUNTER — Other Ambulatory Visit: Payer: Self-pay

## 2021-10-18 ENCOUNTER — Inpatient Hospital Stay: Payer: No Typology Code available for payment source | Admitting: Oncology

## 2021-10-18 ENCOUNTER — Inpatient Hospital Stay: Payer: No Typology Code available for payment source | Attending: Oncology

## 2021-11-16 ENCOUNTER — Emergency Department: Payer: 59

## 2021-11-16 ENCOUNTER — Emergency Department
Admission: EM | Admit: 2021-11-16 | Discharge: 2021-11-16 | Disposition: A | Discharge status: Home / Self Care | Payer: 59 | Attending: Emergency Medicine | Admitting: Emergency Medicine

## 2021-11-16 ENCOUNTER — Other Ambulatory Visit: Payer: Self-pay

## 2021-11-16 ENCOUNTER — Encounter: Payer: Self-pay | Admitting: Emergency Medicine

## 2021-11-16 DIAGNOSIS — J982 Interstitial emphysema: Secondary | ICD-10-CM | POA: Diagnosis not present

## 2021-11-16 DIAGNOSIS — I10 Essential (primary) hypertension: Secondary | ICD-10-CM | POA: Insufficient documentation

## 2021-11-16 DIAGNOSIS — S31010A Laceration without foreign body of lower back and pelvis without penetration into retroperitoneum, initial encounter: Secondary | ICD-10-CM | POA: Diagnosis not present

## 2021-11-16 DIAGNOSIS — T148XXA Other injury of unspecified body region, initial encounter: Secondary | ICD-10-CM

## 2021-11-16 DIAGNOSIS — S199XXA Unspecified injury of neck, initial encounter: Secondary | ICD-10-CM | POA: Diagnosis present

## 2021-11-16 DIAGNOSIS — S20411A Abrasion of right back wall of thorax, initial encounter: Secondary | ICD-10-CM | POA: Insufficient documentation

## 2021-11-16 DIAGNOSIS — S1181XA Laceration without foreign body of other specified part of neck, initial encounter: Secondary | ICD-10-CM | POA: Diagnosis not present

## 2021-11-16 LAB — COMPREHENSIVE METABOLIC PANEL
ALT: 43 U/L (ref 0–44)
AST: 51 U/L — ABNORMAL HIGH (ref 15–41)
Albumin: 4.8 g/dL (ref 3.5–5.0)
Alkaline Phosphatase: 60 U/L (ref 38–126)
Anion gap: 11 (ref 5–15)
BUN: 9 mg/dL (ref 6–20)
CO2: 23 mmol/L (ref 22–32)
Calcium: 9.7 mg/dL (ref 8.9–10.3)
Chloride: 108 mmol/L (ref 98–111)
Creatinine, Ser: 0.82 mg/dL (ref 0.61–1.24)
GFR, Estimated: >60 mL/min (ref >60)
Glucose, Bld: 109 mg/dL — ABNORMAL HIGH (ref 70–99)
Potassium: 3.7 mmol/L (ref 3.5–5.1)
Sodium: 142 mmol/L (ref 135–145)
Total Bilirubin: 0.9 mg/dL (ref 0.3–1.2)
Total Protein: 7.7 g/dL (ref 6.5–8.1)

## 2021-11-16 LAB — CBC WITH DIFFERENTIAL/PLATELET
Abs Immature Granulocytes: 0.05 10*3/uL (ref 0.00–0.07)
Basophils Absolute: 0.1 10*3/uL (ref 0.0–0.1)
Basophils Relative: 0 %
Eosinophils Absolute: 0 10*3/uL (ref 0.0–0.5)
Eosinophils Relative: 0 %
HCT: 46.6 % (ref 39.0–52.0)
Hemoglobin: 16.7 g/dL (ref 13.0–17.0)
Immature Granulocytes: 0 %
Lymphocytes Relative: 9 %
Lymphs Abs: 1.3 10*3/uL (ref 0.7–4.0)
MCH: 34.9 pg — ABNORMAL HIGH (ref 26.0–34.0)
MCHC: 35.8 g/dL (ref 30.0–36.0)
MCV: 97.5 fL (ref 80.0–100.0)
Monocytes Absolute: 0.8 10*3/uL (ref 0.1–1.0)
Monocytes Relative: 6 %
Neutro Abs: 11.4 10*3/uL — ABNORMAL HIGH (ref 1.7–7.7)
Neutrophils Relative %: 85 %
Platelets: 233 10*3/uL (ref 150–400)
RBC: 4.78 MIL/uL (ref 4.22–5.81)
RDW: 11.8 % (ref 11.5–15.5)
WBC: 13.6 10*3/uL — ABNORMAL HIGH (ref 4.0–10.5)
nRBC: 0 % (ref 0.0–0.2)

## 2021-11-16 MED ORDER — IOHEXOL 350 MG/ML SOLN
100.0000 mL | Freq: Once | INTRAVENOUS | Status: AC | PRN
  Administered 2021-11-16: 100 mL via INTRAVENOUS
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
## 2021-11-16 NOTE — ED Notes (Signed)
Pt calm and cooperative and complains of back pain. Pt ambulated without difficulty. All bleeding is controlled. Pt placed in room and advised to put on gown.
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
## 2021-11-16 NOTE — ED Notes (Addendum)
MD McHugh at bedside. 
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
